# Patient Record
Sex: Male | Born: 1990 | Race: White | Hispanic: No | Marital: Married | Smoking: Never smoker
Health system: Southern US, Community
[De-identification: ages and names within clinical notes are randomized; demographics above are authoritative.]

## PROBLEM LIST (undated history)

## (undated) DIAGNOSIS — A4902 Methicillin resistant Staphylococcus aureus infection, unspecified site: Secondary | ICD-10-CM

## (undated) HISTORY — PX: HERNIA REPAIR: SHX51

---

## 2011-04-16 ENCOUNTER — Emergency Department (HOSPITAL_BASED_OUTPATIENT_CLINIC_OR_DEPARTMENT_OTHER)
Admission: EM | Admit: 2011-04-16 | Discharge: 2011-04-16 | Disposition: A | Discharge status: Home / Self Care | Payer: BC Managed Care – PPO | Attending: Emergency Medicine | Admitting: Emergency Medicine

## 2011-04-16 ENCOUNTER — Encounter: Payer: Self-pay | Admitting: *Deleted

## 2011-04-16 DIAGNOSIS — T1590XA Foreign body on external eye, part unspecified, unspecified eye, initial encounter: Secondary | ICD-10-CM | POA: Insufficient documentation

## 2011-04-16 DIAGNOSIS — IMO0002 Reserved for concepts with insufficient information to code with codable children: Secondary | ICD-10-CM | POA: Insufficient documentation

## 2011-04-16 DIAGNOSIS — Y9289 Other specified places as the place of occurrence of the external cause: Secondary | ICD-10-CM | POA: Insufficient documentation

## 2011-04-16 MED ORDER — TETRACAINE HCL 0.5 % OP SOLN
2.0000 [drp] | Freq: Once | OPHTHALMIC | Status: AC
  Administered 2011-04-16: 2 [drp] via OPHTHALMIC
  Filled 2011-04-16: qty 2

## 2011-04-16 MED ORDER — TOBRAMYCIN 0.3 % OP SOLN: 1.0000 [drp] | OPHTHALMIC | Status: AC

## 2011-04-16 MED ORDER — TOBRAMYCIN 0.3 % OP SOLN: 1.0000 [drp] | OPHTHALMIC | Status: DC

## 2011-04-16 MED ORDER — FLUORESCEIN SODIUM 1 MG OP STRP
1.0000 | ORAL_STRIP | Freq: Once | OPHTHALMIC | Status: AC
  Administered 2011-04-16: 1 via OPHTHALMIC
  Filled 2011-04-16: qty 1

## 2011-04-16 NOTE — ED Notes (Signed)
Pt c/o pain to left eye and redness since last night. 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 FB noted during exam. States he has flushed his eye and tried to "scrape the black thing off", but with no relief.

## 2011-04-16 NOTE — ED Provider Notes (Signed)
History     CSN: [id number] Arrival date & time: 04/16/2011  8:15 PM  Chief Complaint  Patient presents with  . Foreign Body in Eye   HPI Patient is a 20 year old male complaining of foreign body in his left eye. 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 He states he feels like he had something fall into his eye last night. It just began to bother him today. He should states he is able to see a little black spot on the left side of his pupil. Unable to get it out. Describes the pain as sore and aching. Denies change in vision.  History reviewed. No pertinent past medical history.  History reviewed. No pertinent past surgical history.  No family history on file.  History  Substance Use Topics  . Smoking status: Never Smoker   . Smokeless tobacco: Not on file  . Alcohol Use: No      Review of Systems  Eyes: Positive for pain, redness and itching. Negative for photophobia.  Neurological: Negative for dizziness and headaches.  All other systems reviewed and are negative.    Physical Exam  BP 124/57  Pulse 67  Temp(Src) 98.3 F (36.8 C) (Oral)  Resp 18  Ht 6' (1.829 m)  Wt 180 lb (81.647 kg)  BMI 24.41 kg/m2  SpO2 100%  Physical Exam  Constitutional: He is oriented to person, place, and time. He appears well-developed and well-nourished.  HENT:  Head: Normocephalic and atraumatic.  Eyes: EOM and lids are normal. Pupils are equal, round, and reactive to light. Right conjunctiva is injected.  Slit lamp exam:      The right eye shows corneal abrasion and foreign body.       The left eye shows fluorescein uptake.    Neurological: He is alert and oriented to person, place, and time.  Skin: Skin is warm and dry. No rash noted. No erythema. No pallor.  Psychiatric: He has a normal mood and affect. His behavior is normal.    ED Course  FOREIGN BODY REMOVAL Performed by: Thomasene Lot Authorized by: Joya Gaskins    MDM   Visual acuity 20/20 both eyes    Thomasene Lot, PA 04/16/11 2227

## 2011-04-16 NOTE — ED Provider Notes (Signed)
Medical screening examination/treatment/procedure(s) were conducted as a shared visit with non-physician practitioner(s) and myself.  I personally evaluated the patient during the encounter   On my exam, pt has rust ring to left eye.  No FB noted. Visual acuity noted D/w dr Gwen Pounds, start tobramycin and f/u tomorrow for rust ring removal Pt agreeable Globe intact on my exam  Joya Gaskins, MD 04/17/11 0000

## 2011-06-07 ENCOUNTER — Emergency Department (INDEPENDENT_AMBULATORY_CARE_PROVIDER_SITE_OTHER): Payer: BC Managed Care – PPO

## 2011-06-07 ENCOUNTER — Encounter (HOSPITAL_BASED_OUTPATIENT_CLINIC_OR_DEPARTMENT_OTHER): Payer: Self-pay | Admitting: *Deleted

## 2011-06-07 ENCOUNTER — Emergency Department (HOSPITAL_BASED_OUTPATIENT_CLINIC_OR_DEPARTMENT_OTHER)
Admission: EM | Admit: 2011-06-07 | Discharge: 2011-06-07 | Disposition: A | Discharge status: Home / Self Care | Payer: BC Managed Care – PPO | Attending: Emergency Medicine | Admitting: Emergency Medicine

## 2011-06-07 DIAGNOSIS — W268XXA Contact with other sharp object(s), not elsewhere classified, initial encounter: Secondary | ICD-10-CM | POA: Insufficient documentation

## 2011-06-07 DIAGNOSIS — R609 Edema, unspecified: Secondary | ICD-10-CM

## 2011-06-07 DIAGNOSIS — L02519 Cutaneous abscess of unspecified hand: Secondary | ICD-10-CM | POA: Insufficient documentation

## 2011-06-07 DIAGNOSIS — Y9289 Other specified places as the place of occurrence of the external cause: Secondary | ICD-10-CM | POA: Insufficient documentation

## 2011-06-07 DIAGNOSIS — L03114 Cellulitis of left upper limb: Secondary | ICD-10-CM

## 2011-06-07 HISTORY — DX: Methicillin resistant Staphylococcus aureus infection, unspecified site: A49.02

## 2011-06-07 MED ORDER — HYDROCODONE-ACETAMINOPHEN 5-325 MG PO TABS: 2.0000 | ORAL_TABLET | ORAL | Status: AC | PRN

## 2011-06-07 MED ORDER — OXYCODONE-ACETAMINOPHEN 5-325 MG PO TABS
2.0000 | ORAL_TABLET | Freq: Once | ORAL | Status: AC
  Administered 2011-06-07: 2 via ORAL
  Filled 2011-06-07: qty 2

## 2011-06-07 MED ORDER — VANCOMYCIN HCL IN DEXTROSE 1-5 GM/200ML-% IV SOLN
1000.0000 mg | Freq: Once | INTRAVENOUS | Status: AC
  Administered 2011-06-07: 1000 mg via INTRAVENOUS
  Filled 2011-06-07: qty 200

## 2011-06-07 MED ORDER — CEPHALEXIN 500 MG PO CAPS: 500.0000 mg | ORAL_CAPSULE | Freq: Four times a day (QID) | ORAL | Status: AC

## 2011-06-07 MED ORDER — DEXTROSE 5 % IV SOLN
1.0000 g | INTRAVENOUS | Status: DC
  Administered 2011-06-07: 1 g via INTRAVENOUS
  Filled 2011-06-07 (×2): qty 10

## 2011-06-07 NOTE — ED Provider Notes (Signed)
History     CSN: [id number] Arrival date & time: 06/07/2011  6:53 PM   First MD Initiated Contact with Patient 06/07/11 1913      Chief Complaint  Patient presents with  . Hand Problem    (Consider location/radiation/quality/duration/timing/severity/associated sxs/prior treatment) Patient is a 20 y.o. male presenting with hand injury. The history is provided by the patient. No language interpreter was used.  Hand Injury  The incident occurred more than 2 days ago. The incident occurred at work. The injury mechanism was an incision. The pain is present in the left hand. The quality of the pain is described as aching and throbbing. The pain is at a severity of 8/10. The pain is moderate. He reports no foreign bodies present. The treatment provided moderate relief.  Pt has been on antibiotics since Thursday.  Pt reports he was cut with a piece of metal at work and hand is swelling.  Past Medical History  Diagnosis Date  . MRSA (methicillin resistant Staphylococcus aureus)     Past Surgical History  Procedure Date  . Hernia repair     History reviewed. No pertinent family history.  History  Substance Use Topics  . Smoking status: Never Smoker   . Smokeless tobacco: Not on file  . Alcohol Use: No      Review of Systems  Musculoskeletal: Positive for myalgias.  Skin: Positive for wound.  All other systems reviewed and are negative.    Allergies  Review of patient's allergies indicates no known allergies.  Home Medications   Current Outpatient Rx  Name Route Sig Dispense Refill  . DOXYCYCLINE HYCLATE 100 MG PO CPEP Oral Take 100 mg by mouth 2 (two) times daily.      . NEOMY-BACIT-POLYMYX-PRAMOXINE 1 % EX OINT Topical Apply 1 application topically 3 (three) times daily.        BP 121/51  Pulse 78  Temp(Src) 98.2 F (36.8 C) (Oral)  Resp 18  Ht 6' (1.829 m)  Wt 182 lb (82.555 kg)  BMI 24.68 kg/m2  SpO2 99%  Physical Exam  Nursing note and vitals  reviewed. Constitutional: He appears well-developed and well-nourished.  HENT:  Head: Normocephalic.  Eyes: Pupils are equal, round, and reactive to light.  Cardiovascular: Normal rate.   Pulmonary/Chest: Effort normal.  Musculoskeletal: He exhibits edema and tenderness.  Neurological: He is alert.  Skin: Skin is warm. There is erythema.  Psychiatric: He has a normal mood and affect.    ED Course  Procedures (including critical care time)  Labs Reviewed - No data to display Dg Hand Complete Left  06/07/2011  *RADIOLOGY REPORT*  Clinical Data: Laceration 1 week ago.  Swelling.  LEFT HAND - COMPLETE 3+ VIEW  Comparison: None  Findings: There is soft tissue swelling in the hypothenar eminence region.  No evidence of radiopaque foreign object or fracture.  IMPRESSION: Soft tissue swelling in the hypothenar eminence region.  Original Report Authenticated By: Thomasenia Sales, M.D.     No diagnosis found.    MDM  I spoke with Dr. Amanda Pea.  Pt given IV antibiotics.  Pt advised to return here tomorrow for rechec with me and 2nd antibiotic dosage.        Langston Masker, [place] 06/07/11 2236  Langston Masker, [place] 06/07/11 2237

## 2011-06-07 NOTE — ED Provider Notes (Signed)
Medical screening examination/treatment/procedure(s) were performed by non-physician practitioner and as supervising physician I was immediately available for consultation/collaboration.   Glynn Octave, MD 06/07/11 [phone]

## 2011-06-07 NOTE — ED Notes (Signed)
Pt reports he cut his hand on metal at work 1 week ago- went to doctor on Thursday and was given doxycycline- woke up Friday am and hand was red and swollen- has hx of MRSA

## 2011-06-08 ENCOUNTER — Emergency Department (HOSPITAL_BASED_OUTPATIENT_CLINIC_OR_DEPARTMENT_OTHER)
Admission: EM | Admit: 2011-06-08 | Discharge: 2011-06-08 | Disposition: A | Discharge status: Home / Self Care | Payer: BC Managed Care – PPO | Attending: Emergency Medicine | Admitting: Emergency Medicine

## 2011-06-08 ENCOUNTER — Encounter (HOSPITAL_BASED_OUTPATIENT_CLINIC_OR_DEPARTMENT_OTHER): Payer: Self-pay | Admitting: Emergency Medicine

## 2011-06-08 DIAGNOSIS — L02519 Cutaneous abscess of unspecified hand: Secondary | ICD-10-CM | POA: Insufficient documentation

## 2011-06-08 DIAGNOSIS — L03119 Cellulitis of unspecified part of limb: Secondary | ICD-10-CM | POA: Insufficient documentation

## 2011-06-08 DIAGNOSIS — L03114 Cellulitis of left upper limb: Secondary | ICD-10-CM

## 2011-06-08 MED ORDER — VANCOMYCIN HCL IN DEXTROSE 1-5 GM/200ML-% IV SOLN
1000.0000 mg | Freq: Once | INTRAVENOUS | Status: AC
  Administered 2011-06-08: 1000 mg via INTRAVENOUS
  Filled 2011-06-08: qty 200

## 2011-06-08 MED ORDER — DEXTROSE 5 % IV SOLN
1.0000 g | INTRAVENOUS | Status: DC
  Administered 2011-06-08: 1 g via INTRAVENOUS
  Filled 2011-06-08: qty 10

## 2011-06-08 NOTE — ED Provider Notes (Signed)
History     CSN: [id number] Arrival date & time: 06/08/2011 12:09 PM   First MD Initiated Contact with Patient 06/08/11 1216      Chief Complaint  Patient presents with  . Wound Check    (Consider location/radiation/quality/duration/timing/severity/associated sxs/prior treatment) Patient is a 20 y.o. male presenting with hand pain. The history is provided by the patient. No language interpreter was used.  Hand Pain This is a new problem. The current episode started in the past 7 days. The problem occurs constantly. The problem has been gradually improving. The symptoms are aggravated by nothing. He has tried nothing for the symptoms.  Hand Pain This is a new problem. The current episode started in the past 7 days. The problem occurs constantly. The problem has been gradually improving. The symptoms are aggravated by nothing. He has tried nothing for the symptoms.  Pt seen here by me yesterday and given Iv antibiotics,  I spoke to Dr. Amanda Pea for advise.  Pt here for 2nd dosage of antibiotics  Past Medical History  Diagnosis Date  . MRSA (methicillin resistant Staphylococcus aureus)     Past Surgical History  Procedure Date  . Hernia repair     History reviewed. No pertinent family history.  History  Substance Use Topics  . Smoking status: Never Smoker   . Smokeless tobacco: Not on file  . Alcohol Use: No      Review of Systems  Skin: Positive for wound.  All other systems reviewed and are negative.    Allergies  Review of patient's allergies indicates no known allergies.  Home Medications   Current Outpatient Rx  Name Route Sig Dispense Refill  . CEPHALEXIN 500 MG PO CAPS Oral Take 1 capsule (500 mg total) by mouth 4 (four) times daily. 40 capsule 0  . DOXYCYCLINE HYCLATE 100 MG PO CPEP Oral Take 100 mg by mouth 2 (two) times daily.      Marland Kitchen HYDROCODONE-ACETAMINOPHEN 5-325 MG PO TABS Oral Take 2 tablets by mouth every 4 (four) hours as needed for pain. 16 tablet  0  . NEOMY-BACIT-POLYMYX-PRAMOXINE 1 % EX OINT Topical Apply 1 application topically 3 (three) times daily.        BP 116/51  Pulse 62  Temp(Src) 97.8 F (36.6 C) (Oral)  Resp 18  Ht 6' (1.829 m)  Wt 183 lb 1.6 oz (83.054 kg)  BMI 24.83 kg/m2  SpO2 100%  Physical Exam  Nursing note and vitals reviewed. Constitutional: He is oriented to person, place, and time. He appears well-developed and well-nourished.  Musculoskeletal: He exhibits edema and tenderness.       Swollen left hand,  Decreased redness,  Decreased erythema,  Looks improved from yesterday  Neurological: He is alert and oriented to person, place, and time.  Skin: Skin is warm. There is erythema.  Psychiatric: He has a normal mood and affect.    ED Course  Procedures (including critical care time)  Labs Reviewed - No data to display Dg Hand Complete Left  06/07/2011  *RADIOLOGY REPORT*  Clinical Data: Laceration 1 week ago.  Swelling.  LEFT HAND - COMPLETE 3+ VIEW  Comparison: None  Findings: There is soft tissue swelling in the hypothenar eminence region.  No evidence of radiopaque foreign object or fracture.  IMPRESSION: Soft tissue swelling in the hypothenar eminence region.  Original Report Authenticated By: Thomasenia Sales, M.D.     1. Cellulitis of hand, left       MDM  Pt given IV  antibiotics x 2,  Pt advised to continue po medications.  Pt to call Dr. Amanda Pea in am to be seen for evaluation.    Medical screening examination/treatment/procedure(s) were performed by non-physician practitioner and as supervising physician I was immediately available for consultation/collaboration. Osvaldo Human, M.D.    [place], [place] 06/08/11 1441  Carleene Cooper III, MD 06/09/11 [phone]

## 2011-06-08 NOTE — ED Notes (Signed)
Wound recheck.  No problems with hand.  No red streaks.  No known fever.

## 2011-06-08 NOTE — ED Notes (Signed)
Pt d/c home with instructions to f/u tomorrow with Dr Amanda Pea. Family present to drive. No new rx given. Pt v/o understanding to continue antibiotics

## 2011-06-10 LAB — WOUND CULTURE: Gram Stain: NONE SEEN

## 2011-06-10 NOTE — ED Notes (Signed)
+   MRSA Patient treated with Cephalexin-chart sent to EDP office for review.

## 2011-06-17 NOTE — ED Notes (Signed)
Start Bactrim DS 160/800 1 po BID x 10 days  Per Cherrie Distance.

## 2011-06-18 ENCOUNTER — Telehealth (HOSPITAL_COMMUNITY): Payer: Self-pay | Admitting: Emergency Medicine

## 2016-09-05 ENCOUNTER — Ambulatory Visit
Admission: EM | Admit: 2016-09-05 | Discharge: 2016-09-05 | Disposition: A | Discharge status: Home / Self Care | Payer: BLUE CROSS/BLUE SHIELD | Attending: Internal Medicine | Admitting: Internal Medicine

## 2016-09-05 DIAGNOSIS — M25511 Pain in right shoulder: Secondary | ICD-10-CM

## 2016-09-05 DIAGNOSIS — G8911 Acute pain due to trauma: Secondary | ICD-10-CM

## 2016-09-05 MED ORDER — NAPROXEN 500 MG PO TABS: 500.0000 mg | ORAL_TABLET | Freq: Two times a day (BID) | ORAL | 0 refills | Status: DC

## 2016-09-05 NOTE — ED Triage Notes (Signed)
Pt reports on Monday or Tuesday he fell forward and injured his right shoulder, but unsure how. Can lift his arm but not without pain. 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 Pain 2/10 at rest, but increases to 9/10 with movement.

## 2016-09-05 NOTE — Discharge Instructions (Addendum)
Suspect rotator cuff injury based on type of pain and what movements hurt the most.  Anticipate good recovery, but may not be completely pain free in a month.  Ice 5-10 minutes 2-4 times daily; prescription for naproxen (anti inflammatory, pain relief) sent to pharmacy.  Limit painful activities. Followup with orthopedist.  Note for work x 3d.

## 2016-09-05 NOTE — ED Provider Notes (Signed)
[facility]    CSN: [id number] Arrival date & time: 09/05/16  0849     History   Chief Complaint Chief Complaint  Patient presents with  . Shoulder Injury    HPI Marvin Dominguez is a 26 y.o. male. 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 He was holding a heavy box overhead 3 days ago, and lost his balance and fell, presumably hyperextending the shoulder. He has had significant pain in the right shoulder since. No neck or back pain, no other injuries reported. Pain is 2 out of 10 at rest, but with movement 9 out of 10. He is able to extend the arm fully overhead. No elbow symptoms, no hand or wrist symptoms.   HPI  Past Medical History:  Diagnosis Date  . MRSA (methicillin resistant Staphylococcus aureus)     Past Surgical History:  Procedure Laterality Date  . HERNIA REPAIR         Home Medications    Prior to Admission medications   Medication Sig Start Date End Date Taking? Authorizing Provider  naproxen (NAPROSYN) 500 MG tablet Take 1 tablet (500 mg total) by mouth 2 (two) times daily. 09/05/16   Eustace Moore, MD    Family History History reviewed. No pertinent family history.  Social History Social History  Substance Use Topics  . Smoking status: Never Smoker  . Smokeless tobacco: Never Used  . Alcohol use Yes     Comment: social     Allergies   Patient has no known allergies.   Review of Systems Review of Systems  All other systems reviewed and are negative.    Physical Exam Triage Vital Signs ED Triage Vitals  Enc Vitals Group     BP 09/05/16 0910 115/62     Pulse Rate 09/05/16 0910 60     Resp 09/05/16 0910 18     Temp 09/05/16 0910 97.9 F (36.6 C)     Temp Source 09/05/16 0910 Oral     SpO2 09/05/16 0910 100 %     Weight 09/05/16 0911 180 lb (81.6 kg)     Height 09/05/16 0911 5\' 11"  (1.803 m)     Pain Score 09/05/16 0913 2     Pain Loc --    Updated Vital Signs BP 115/62  (BP Location: Left Arm)   Pulse 60   Temp 97.9 F (36.6 C) (Oral)   Resp 18   Ht 5\' 11"  (1.803 m)   Wt 180 lb (81.6 kg)   SpO2 100%   BMI 25.10 kg/m   Physical Exam  Constitutional: He is oriented to person, place, and time. No distress.  Alert, nicely groomed  HENT:  Head: Atraumatic.  Eyes:  Conjugate gaze, no eye redness/drainage  Neck: Neck supple.  Shoulder pain does not change with neck range of motion.  Cardiovascular: Normal rate.   Pulmonary/Chest: No respiratory distress.  Abdominal: Soft. He exhibits no distension.  Musculoskeletal: Normal range of motion.  Able to fully extend the right arm overhead unassisted, but painful. Also painful to externally rotate. Less painful to internally rotate. Hand is warm with good wrist pulses. Shoulder is tender to palpation anteriorly.  Neurological: He is alert and oriented to person, place, and time.  Skin: Skin is warm and dry.  No cyanosis  Nursing note and vitals reviewed.    UC Treatments / Results   Procedures Procedures (including critical care time) None today  Final  Clinical Impressions(s) / UC Diagnoses   Final diagnoses:  Acute shoulder pain due to trauma, right  probably acute rotator cuff injury  Suspect rotator cuff injury based on type of pain and what movements hurt the most.  Anticipate good recovery, but may not be completely pain free in a month.  Ice 5-10 minutes 2-4 times daily; prescription for naproxen (anti inflammatory, pain relief) sent to pharmacy.  Limit painful activities. Followup with orthopedist.  Note for work x 3d; RTW 09/09/16; further work note/duty restrictions per orthopedics/sports med.  New Prescriptions New Prescriptions   NAPROXEN (NAPROSYN) 500 MG TABLET    Take 1 tablet (500 mg total) by mouth 2 (two) times daily.     Eustace MooreLaura W Jonise Weightman, MD 09/06/16 2151

## 2017-08-28 ENCOUNTER — Other Ambulatory Visit: Payer: Self-pay | Admitting: Physician Assistant

## 2017-08-28 DIAGNOSIS — S46011A Strain of muscle(s) and tendon(s) of the rotator cuff of right shoulder, initial encounter: Secondary | ICD-10-CM

## 2017-09-04 ENCOUNTER — Ambulatory Visit: Payer: BLUE CROSS/BLUE SHIELD

## 2017-09-11 ENCOUNTER — Ambulatory Visit: Payer: BLUE CROSS/BLUE SHIELD

## 2017-10-02 ENCOUNTER — Ambulatory Visit: Payer: BLUE CROSS/BLUE SHIELD

## 2017-10-19 ENCOUNTER — Ambulatory Visit
Admission: RE | Admit: 2017-10-19 | Discharge: 2017-10-19 | Disposition: A | Discharge status: Home / Self Care | Payer: BC Managed Care – PPO | Source: Ambulatory Visit | Attending: Physician Assistant | Admitting: Physician Assistant

## 2017-10-19 DIAGNOSIS — S46011A Strain of muscle(s) and tendon(s) of the rotator cuff of right shoulder, initial encounter: Secondary | ICD-10-CM | POA: Insufficient documentation

## 2017-10-19 DIAGNOSIS — W19XXXA Unspecified fall, initial encounter: Secondary | ICD-10-CM | POA: Diagnosis not present

## 2017-10-19 MED ORDER — LIDOCAINE HCL (PF) 1 % IJ SOLN
10.0000 mL | Freq: Once | INTRAMUSCULAR | Status: AC
  Administered 2017-10-19: 10 mL
  Filled 2017-10-19: qty 10

## 2017-10-19 MED ORDER — IOPAMIDOL (ISOVUE-200) INJECTION 41%
15.0000 mL | Freq: Once | INTRAVENOUS | Status: AC | PRN
  Administered 2017-10-19: 10 mL
  Filled 2017-10-19: qty 50

## 2017-10-19 MED ORDER — GADOBENATE DIMEGLUMINE 529 MG/ML IV SOLN
0.1000 mL | Freq: Once | INTRAVENOUS | Status: AC | PRN
  Administered 2017-10-19: 0.1 mL via INTRA_ARTICULAR

## 2018-07-16 ENCOUNTER — Other Ambulatory Visit: Payer: Self-pay | Admitting: Orthopedic Surgery

## 2018-07-19 ENCOUNTER — Other Ambulatory Visit: Payer: Self-pay

## 2018-07-19 ENCOUNTER — Encounter
Admission: RE | Admit: 2018-07-19 | Discharge: 2018-07-19 | Disposition: A | Discharge status: Home / Self Care | Payer: BC Managed Care – PPO | Source: Ambulatory Visit | Attending: Orthopedic Surgery | Admitting: Orthopedic Surgery

## 2018-07-19 MED ORDER — CEFAZOLIN SODIUM-DEXTROSE 2-4 GM/100ML-% IV SOLN
2.0000 g | INTRAVENOUS | Status: AC
  Administered 2018-07-20: 2 g via INTRAVENOUS

## 2018-07-19 NOTE — Patient Instructions (Addendum)
  Your procedure is scheduled on: Tuesday July 20, 2018 @ 8:00am Report to Same Day Surgery 2nd floor [facility](Medical Mall Entrance-take elevator on left to 2nd floor.  Check in with surgery information desk.)  Remember: Instructions that are not followed completely may result in serious medical risk, up to and including death, or upon the discretion of your surgeon and anesthesiologist your surgery may need to be rescheduled.    __x__ 1. Do not eat food (including mints, candies, chewing gum) after midnight the night before your procedure. You may drink clear liquids up to 2 hours before you are scheduled to arrive at the hospital for your procedure.  Do not drink anything within 2 hours of your scheduled arrival to the hospital.  Approved clear liquids:  --Water or Apple juice without pulp  --Clear carbohydrate beverage such as Gatorade or Powerade  --Black Coffee or Clear Tea (No milk, no creamers, do not add anything to the coffee or tea)    __x__ 2. No Alcohol for 24 hours before or after surgery.   __x__ 3. No Smoking or e-cigarettes for 24 hours before surgery.  Do not use any chewable tobacco products for at least 6 hours before surgery.   __x__ 4. Notify your doctor if there is any change in your medical condition (cold, fever, infections).   __x__ 5. On the morning of surgery brush your teeth with toothpaste and water.  You may rinse your mouth with mouthwash if you wish.  Do not swallow any toothpaste or mouthwash.   Do not wear jewelry on the day of surgery.  Do not wear lotions, powders, deodorant, or perfumes.   Do not bring valuables to the hospital.    [facility] is not responsible for any belongings or valuables.               Contacts, dentures or bridgework may not be worn into surgery.  For patients discharged on the day of surgery, you will NOT be permitted to drive yourself home.  You must have a responsible adult with you for 24 hours after  surgery.  __x__ Take anti-hypertensive listed below, cardiac, seizure, asthma, anti-reflux and psychiatric medicines. These include:  1. None on the morning of surgery  RN Reviewed on phone w/ patient on 07/19/2018

## 2018-07-20 ENCOUNTER — Encounter
Admission: RE | Disposition: A | Discharge status: Home / Self Care | Payer: Self-pay | Source: Home / Self Care | Attending: Orthopedic Surgery

## 2018-07-20 ENCOUNTER — Ambulatory Visit
Admission: RE | Admit: 2018-07-20 | Discharge: 2018-07-20 | Disposition: A | Discharge status: Home / Self Care | Payer: BC Managed Care – PPO | Attending: Orthopedic Surgery | Admitting: Orthopedic Surgery

## 2018-07-20 ENCOUNTER — Ambulatory Visit: Payer: BC Managed Care – PPO | Admitting: Certified Registered"

## 2018-07-20 DIAGNOSIS — S43431A Superior glenoid labrum lesion of right shoulder, initial encounter: Secondary | ICD-10-CM | POA: Diagnosis present

## 2018-07-20 DIAGNOSIS — X58XXXA Exposure to other specified factors, initial encounter: Secondary | ICD-10-CM | POA: Insufficient documentation

## 2018-07-20 HISTORY — PX: SHOULDER ARTHROSCOPY WITH SUBACROMIAL DECOMPRESSION AND BICEP TENDON REPAIR: SHX5689

## 2018-07-20 LAB — PROTIME-INR
INR: 1.1
Prothrombin Time: 14.1 seconds (ref 11.4–15.2)

## 2018-07-20 LAB — CBC WITH DIFFERENTIAL/PLATELET
ABS IMMATURE GRANULOCYTES: 0.02 10*3/uL (ref 0.00–0.07)
Basophils Absolute: 0 10*3/uL (ref 0.0–0.1)
Basophils Relative: 1 %
Eosinophils Absolute: 0.1 10*3/uL (ref 0.0–0.5)
Eosinophils Relative: 2 %
HCT: 45.4 % (ref 39.0–52.0)
Hemoglobin: 15.8 g/dL (ref 13.0–17.0)
IMMATURE GRANULOCYTES: 0 %
Lymphocytes Relative: 32 %
Lymphs Abs: 2 10*3/uL (ref 0.7–4.0)
MCH: 29.9 pg (ref 26.0–34.0)
MCHC: 34.8 g/dL (ref 30.0–36.0)
MCV: 86 fL (ref 80.0–100.0)
MONO ABS: 0.6 10*3/uL (ref 0.1–1.0)
MONOS PCT: 10 %
NEUTROS PCT: 55 %
Neutro Abs: 3.5 10*3/uL (ref 1.7–7.7)
PLATELETS: 195 10*3/uL (ref 150–400)
RBC: 5.28 MIL/uL (ref 4.22–5.81)
RDW: 12.2 % (ref 11.5–15.5)
WBC: 6.3 10*3/uL (ref 4.0–10.5)
nRBC: 0 % (ref 0.0–0.2)

## 2018-07-20 LAB — BASIC METABOLIC PANEL
Anion gap: 7 (ref 5–15)
BUN: 17 mg/dL (ref 6–20)
CALCIUM: 8.8 mg/dL — AB (ref 8.9–10.3)
CO2: 25 mmol/L (ref 22–32)
Chloride: 106 mmol/L (ref 98–111)
Creatinine, Ser: 0.93 mg/dL (ref 0.61–1.24)
Glucose, Bld: 98 mg/dL (ref 70–99)
Potassium: 3.8 mmol/L (ref 3.5–5.1)
Sodium: 138 mmol/L (ref 135–145)

## 2018-07-20 LAB — APTT: aPTT: 35 seconds (ref 24–36)

## 2018-07-20 SURGERY — SHOULDER ARTHROSCOPY WITH SUBACROMIAL DECOMPRESSION AND BICEP TENDON REPAIR
Anesthesia: General | Site: Shoulder | Laterality: Right

## 2018-07-20 MED ORDER — FENTANYL CITRATE (PF) 100 MCG/2ML IJ SOLN
INTRAMUSCULAR | Status: AC
  Administered 2018-07-20: 50 ug
  Filled 2018-07-20: qty 2

## 2018-07-20 MED ORDER — ROCURONIUM BROMIDE 100 MG/10ML IV SOLN
INTRAVENOUS | Status: DC | PRN
  Administered 2018-07-20: 50 mg via INTRAVENOUS

## 2018-07-20 MED ORDER — OXYCODONE HCL 5 MG PO TABS: 5.0000 mg | ORAL_TABLET | Freq: Once | ORAL | Status: DC | PRN

## 2018-07-20 MED ORDER — PROMETHAZINE HCL 25 MG/ML IJ SOLN: 6.2500 mg | INTRAMUSCULAR | Status: DC | PRN

## 2018-07-20 MED ORDER — PHENYLEPHRINE HCL 10 MG/ML IJ SOLN
INTRAMUSCULAR | Status: DC | PRN
  Administered 2018-07-20: 100 ug via INTRAVENOUS

## 2018-07-20 MED ORDER — FAMOTIDINE 20 MG PO TABS
ORAL_TABLET | ORAL | Status: AC
  Administered 2018-07-20: 20 mg via ORAL
  Filled 2018-07-20: qty 1

## 2018-07-20 MED ORDER — MIDAZOLAM HCL 2 MG/2ML IJ SOLN
1.0000 mg | Freq: Once | INTRAMUSCULAR | Status: AC
  Administered 2018-07-20: 1 mg via INTRAVENOUS

## 2018-07-20 MED ORDER — LACTATED RINGERS IV SOLN
INTRAVENOUS | Status: DC | PRN
  Administered 2018-07-20: 13 mL

## 2018-07-20 MED ORDER — CHLORHEXIDINE GLUCONATE CLOTH 2 % EX PADS: 6.0000 | MEDICATED_PAD | Freq: Once | CUTANEOUS | Status: DC

## 2018-07-20 MED ORDER — FENTANYL CITRATE (PF) 100 MCG/2ML IJ SOLN: 25.0000 ug | INTRAMUSCULAR | Status: DC | PRN

## 2018-07-20 MED ORDER — BUPIVACAINE HCL (PF) 0.25 % IJ SOLN
INTRAMUSCULAR | Status: AC
  Filled 2018-07-20: qty 30

## 2018-07-20 MED ORDER — PROPOFOL 10 MG/ML IV BOLUS
INTRAVENOUS | Status: AC
  Filled 2018-07-20: qty 20

## 2018-07-20 MED ORDER — BUPIVACAINE LIPOSOME 1.3 % IJ SUSP
INTRAMUSCULAR | Status: AC
  Filled 2018-07-20: qty 20

## 2018-07-20 MED ORDER — MIDAZOLAM HCL 2 MG/2ML IJ SOLN
INTRAMUSCULAR | Status: AC
  Filled 2018-07-20: qty 2

## 2018-07-20 MED ORDER — CEFAZOLIN SODIUM-DEXTROSE 2-4 GM/100ML-% IV SOLN
INTRAVENOUS | Status: AC
  Filled 2018-07-20: qty 100

## 2018-07-20 MED ORDER — ONDANSETRON HCL 4 MG PO TABS: 4.0000 mg | ORAL_TABLET | Freq: Three times a day (TID) | ORAL | 0 refills | Status: AC | PRN

## 2018-07-20 MED ORDER — LACTATED RINGERS IV SOLN
INTRAVENOUS | Status: DC
  Administered 2018-07-20: 09:00:00 via INTRAVENOUS

## 2018-07-20 MED ORDER — DEXAMETHASONE SODIUM PHOSPHATE 10 MG/ML IJ SOLN
INTRAMUSCULAR | Status: DC | PRN
  Administered 2018-07-20: 5 mg via INTRAVENOUS

## 2018-07-20 MED ORDER — FAMOTIDINE 20 MG PO TABS
20.0000 mg | ORAL_TABLET | Freq: Once | ORAL | Status: AC
  Administered 2018-07-20: 20 mg via ORAL

## 2018-07-20 MED ORDER — LIDOCAINE HCL (CARDIAC) PF 100 MG/5ML IV SOSY
PREFILLED_SYRINGE | INTRAVENOUS | Status: DC | PRN
  Administered 2018-07-20: 100 mg via INTRAVENOUS

## 2018-07-20 MED ORDER — SUGAMMADEX SODIUM 200 MG/2ML IV SOLN
INTRAVENOUS | Status: DC | PRN
  Administered 2018-07-20: 200 mg via INTRAVENOUS

## 2018-07-20 MED ORDER — BUPIVACAINE LIPOSOME 1.3 % IJ SUSP
INTRAMUSCULAR | Status: DC | PRN
  Administered 2018-07-20: 20 mL via PERINEURAL

## 2018-07-20 MED ORDER — LIDOCAINE HCL (PF) 1 % IJ SOLN
INTRAMUSCULAR | Status: DC | PRN
  Administered 2018-07-20: 3 mL

## 2018-07-20 MED ORDER — MEPERIDINE HCL 50 MG/ML IJ SOLN: 6.2500 mg | INTRAMUSCULAR | Status: DC | PRN

## 2018-07-20 MED ORDER — EPHEDRINE SULFATE 50 MG/ML IJ SOLN
INTRAMUSCULAR | Status: DC | PRN
  Administered 2018-07-20: 5 mg via INTRAVENOUS

## 2018-07-20 MED ORDER — BUPIVACAINE HCL (PF) 0.5 % IJ SOLN
INTRAMUSCULAR | Status: AC
  Filled 2018-07-20: qty 10

## 2018-07-20 MED ORDER — OXYCODONE HCL 5 MG PO TABS: 5.0000 mg | ORAL_TABLET | ORAL | 0 refills | Status: AC | PRN

## 2018-07-20 MED ORDER — OXYCODONE HCL 5 MG/5ML PO SOLN: 5.0000 mg | Freq: Once | ORAL | Status: DC | PRN

## 2018-07-20 MED ORDER — FENTANYL CITRATE (PF) 100 MCG/2ML IJ SOLN
INTRAMUSCULAR | Status: AC
  Filled 2018-07-20: qty 2

## 2018-07-20 MED ORDER — PROPOFOL 10 MG/ML IV BOLUS
INTRAVENOUS | Status: DC | PRN
  Administered 2018-07-20: 200 mg via INTRAVENOUS

## 2018-07-20 MED ORDER — BUPIVACAINE HCL (PF) 0.5 % IJ SOLN
INTRAMUSCULAR | Status: DC | PRN
  Administered 2018-07-20: 10 mL via PERINEURAL

## 2018-07-20 MED ORDER — BUPIVACAINE HCL (PF) 0.25 % IJ SOLN
INTRAMUSCULAR | Status: DC | PRN
  Administered 2018-07-20: 20 mL

## 2018-07-20 MED ORDER — LIDOCAINE HCL (PF) 1 % IJ SOLN
INTRAMUSCULAR | Status: AC
  Filled 2018-07-20: qty 5

## 2018-07-20 MED ORDER — FENTANYL CITRATE (PF) 100 MCG/2ML IJ SOLN
50.0000 ug | Freq: Once | INTRAMUSCULAR | Status: AC
  Administered 2018-07-20: 100 ug via INTRAVENOUS

## 2018-07-20 MED ORDER — ONDANSETRON HCL 4 MG/2ML IJ SOLN
INTRAMUSCULAR | Status: DC | PRN
  Administered 2018-07-20: 4 mg via INTRAVENOUS

## 2018-07-20 MED ORDER — EPINEPHRINE 30 MG/30ML IJ SOLN
INTRAMUSCULAR | Status: AC
  Filled 2018-07-20: qty 1

## 2018-07-20 SURGICAL SUPPLY — 61 items
ADAPTER IRRIG TUBE 2 SPIKE SOL (ADAPTER) ×4 IMPLANT
ANCHOR SUT BIOC ST 3X145 (Anchor) ×4 IMPLANT
BUR RADIUS 4.0X18.5 (BURR) ×2 IMPLANT
BUR RADIUS 5.5 (BURR) ×2 IMPLANT
CANISTER SUCT LVC 12 LTR MEDI- (MISCELLANEOUS) IMPLANT
CANNULA 5.75X7 CRYSTAL CLEAR (CANNULA) ×4 IMPLANT
CANNULA PARTIAL THREAD 2X7 (CANNULA) ×2 IMPLANT
CANNULA TWIST IN 8.25X9CM (CANNULA) IMPLANT
COOLER POLAR GLACIER W/PUMP (MISCELLANEOUS) ×2 IMPLANT
COVER WAND RF STERILE (DRAPES) IMPLANT
CRADLE LAMINECT ARM (MISCELLANEOUS) ×2 IMPLANT
DEVICE SUCT BLK HOLE OR FLOOR (MISCELLANEOUS) ×2 IMPLANT
DRAPE IMP U-DRAPE 54X76 (DRAPES) ×4 IMPLANT
DRAPE INCISE IOBAN 66X45 STRL (DRAPES) ×2 IMPLANT
DRAPE SHEET LG 3/4 BI-LAMINATE (DRAPES) ×2 IMPLANT
DRAPE U-SHAPE 47X51 STRL (DRAPES) IMPLANT
DURAPREP 26ML APPLICATOR (WOUND CARE) ×8 IMPLANT
ELECT REM PT RETURN 9FT ADLT (ELECTROSURGICAL)
ELECTRODE REM PT RTRN 9FT ADLT (ELECTROSURGICAL) IMPLANT
GAUZE PETRO XEROFOAM 1X8 (MISCELLANEOUS) ×2 IMPLANT
GAUZE SPONGE 4X4 12PLY STRL (GAUZE/BANDAGES/DRESSINGS) ×2 IMPLANT
GLOVE BIOGEL PI IND STRL 9 (GLOVE) ×1 IMPLANT
GLOVE BIOGEL PI INDICATOR 9 (GLOVE) ×1
GLOVE SURG 9.0 ORTHO LTXF (GLOVE) ×4 IMPLANT
GOWN STRL REUS TWL 2XL XL LVL4 (GOWN DISPOSABLE) ×2 IMPLANT
GOWN STRL REUS W/ TWL LRG LVL3 (GOWN DISPOSABLE) ×2 IMPLANT
GOWN STRL REUS W/ TWL LRG LVL4 (GOWN DISPOSABLE) ×1 IMPLANT
GOWN STRL REUS W/TWL LRG LVL3 (GOWN DISPOSABLE) ×2
GOWN STRL REUS W/TWL LRG LVL4 (GOWN DISPOSABLE) ×1
IV LACTATED RINGER IRRG 3000ML (IV SOLUTION) ×13
IV LR IRRIG 3000ML ARTHROMATIC (IV SOLUTION) ×13 IMPLANT
KIT STABILIZATION SHOULDER (MISCELLANEOUS) ×2 IMPLANT
KIT SUTURETAK 3.0 INSERT PERC (KITS) ×2 IMPLANT
KIT TURNOVER KIT A (KITS) ×2 IMPLANT
MANIFOLD NEPTUNE II (INSTRUMENTS) ×2 IMPLANT
MASK FACE SPIDER DISP (MASK) ×2 IMPLANT
MAT ABSORB  FLUID 56X50 GRAY (MISCELLANEOUS) ×2
MAT ABSORB FLUID 56X50 GRAY (MISCELLANEOUS) ×2 IMPLANT
NEEDLE HYPO 22GX1.5 SAFETY (NEEDLE) ×2 IMPLANT
PACK ARTHROSCOPY SHOULDER (MISCELLANEOUS) ×2 IMPLANT
PAD WRAPON POLAR SHDR XLG (MISCELLANEOUS) ×1 IMPLANT
SET TUBE SUCT SHAVER OUTFL 24K (TUBING) ×2 IMPLANT
SET TUBE TIP INTRA-ARTICULAR (MISCELLANEOUS) ×2 IMPLANT
STRAP SAFETY 5IN WIDE (MISCELLANEOUS) ×2 IMPLANT
STRIP CLOSURE SKIN 1/2X4 (GAUZE/BANDAGES/DRESSINGS) ×2 IMPLANT
SUT ETHILON 4-0 (SUTURE) ×2
SUT ETHILON 4-0 FS2 18XMFL BLK (SUTURE) ×2
SUT LASSO 90 DEG SD STR (SUTURE) ×2 IMPLANT
SUT MNCRL 4-0 (SUTURE)
SUT MNCRL 4-0 27XMFL (SUTURE)
SUT PDS AB 0 CT1 27 (SUTURE) IMPLANT
SUT VIC AB 0 CT1 36 (SUTURE) IMPLANT
SUT VIC AB 2-0 CT2 27 (SUTURE) IMPLANT
SUTURE ETHLN 4-0 FS2 18XMF BLK (SUTURE) ×2 IMPLANT
SUTURE MAGNUM WIRE 2X48 BLK (SUTURE) ×2 IMPLANT
SUTURE MNCRL 4-0 27XMF (SUTURE) IMPLANT
TAPE MICROFOAM 4IN (TAPE) ×2 IMPLANT
TUBING ARTHRO INFLOW-ONLY STRL (TUBING) ×2 IMPLANT
TUBING CONNECTING 10 (TUBING) ×2 IMPLANT
WAND HAND CNTRL MULTIVAC 90 (MISCELLANEOUS) ×2 IMPLANT
WRAPON POLAR PAD SHDR XLG (MISCELLANEOUS) ×2

## 2018-07-20 NOTE — Discharge Instructions (Signed)
AMBULATORY SURGERY  °DISCHARGE INSTRUCTIONS ° ° °1) The drugs that you were given will stay in your system until tomorrow so for the next 24 hours you should not: ° °A) Drive an automobile °B) Make any legal decisions °C) Drink any alcoholic beverage ° ° °2) You may resume regular meals tomorrow.  Today it is better to start with liquids and gradually work up to solid foods. ° °You may eat anything you prefer, but it is better to start with liquids, then soup and crackers, and gradually work up to solid foods. ° ° °3) Please notify your doctor immediately if you have any unusual bleeding, trouble breathing, redness and pain at the surgery site, drainage, fever, or pain not relieved by medication. ° ° ° °4) Additional Instructions: ° ° ° ° ° ° ° °Please contact your physician with any problems or Same Day Surgery at [phone], Monday through Friday 6 am to 4 pm, or [place] [facility] number at [phone]. °

## 2018-07-20 NOTE — Op Note (Signed)
07/20/2018  1:04 PM  PATIENT:  Marvin Dominguez  27 y.o. 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    I discussed the risks and benefits of surgery. The risks include but are not limited to infection, bleeding, nerve or blood vessel injury, joint stiffness or loss of motion, persistent pain, weakness or instability, and hardware failure and the need for further surgery. Medical risks include but are not limited to DVT and pulmonary embolism, myocardial infarction, stroke, pneumonia, respiratory failure and death. Patient understood these risks and wished to proceed.   OPERATIVE IMPLANTS: Arthrex bio suturetak anchors  2   OPERATIVE FINDINGS:  Right shoulder anterior labral tear with chondral flap, fraying of superior labrum  OPERATIVE PROCEDURE:  I met with the patient in the preoperative area.  I signed the right shoulder according the hospital's correct site of surgery protocol.  I answered all questions by the patient.   Patient received an interscalene block with Exparel by the anesthesia service in the preoperative area.  Patient was brought to the operating room.  He underwent general endotracheal intubation.  The patient  was then positioned in a beachchair position. All bony prominences were adequately padded including the lower extremities.  Examination under anesthesia revealed no instability with load and shift testing, and a negative sulcus sign testing respectively.  The patient was then prepped and draped in a sterile fashion. The patient received 2 g of Ancef prior to the onset of the case.  A timeout was performed to verify the patient's name, date of birth, medical record number, correct site of surgery and correct procedure to be performed.The timeout was also used to verify the patient received antibiotics that all appropriate instruments, implants and radiographic studies were available in the room. Once all in attendance were in agreement case began.  Bony landmarks were drawn out with a surgical marker along with proposed incisions. These were pre-injected with 1% lidocaine plain. An 11 blade was used to establish a posterior portal through which the arthroscope was placed in the glenohumeral joint. An anterior portal was established under direct visualization using an 18-gauge spinal needle for localization. A 5.75 mm arthroscopic cannula was inserted through the anterior portal. A full diagnostic examination of the glenohumeral joint was performed.  An anterolateral portal was established again under direct visualization using an 18-gauge spinal needle. A 7 mm cannula was placed through this anterolateral portal.  The superior and posterior labrum were debrided with a 4.0 mm resector shaver blade.  There is no evidence for a SLAP tear.  The subscapularis and biceps tendons were intact.  There is no evidence of a rotator cuff tear.    An arthroscopic periosteal elevator was used to mobilize the anterior labrum.  The associated chondral flap lesion on the bony glenoid at the site of  the labral tear was debrided with a 4.0 mm resector shaver blade.  The nonarticular portion of the anterior glenoid at the site  of the labral tear was burred on the anterior surface until punctate bleeding was identified. An Arthrex NIKE anchor was then placed.  A 90 degree Arthrex suture lasso was then used to shuttle a single limb of this anchor under the anterior inferior labrum.  Arthroscopic knot tying technique was then used to complete the capsulorrhaphy anteriorly.  This process was then repeated for a second anchor placed occipitally 10 mm superior to the previous anchor.  The labrum was then probed following placement of both anchors.  The labrum was well approximated to the glenoid.  The anterior bumper was reestablished.  The labrum filled the void left by the chondral lesion previously debrided. Final images of the repair were taken.  The arthroscope was then placed in the subacromial space.  A lateral portal was established using an 18-gauge spinal needle for localization.  Abundant bursitis was encountered.  This was debrided with a 9 degree ArthroCare wand and 4.0 mm resector shaver blade.  The rotator cuff was examined from the bursal side and showed no evidence of rotator cuff tear.  A subacromial decompression was performed given a subacromial spur anteriorly.  Final arthroscopic images were taken.   All arthroscopic instruments were removed. The 4 arthroscopic portals were closed with 4-0 nylon. A dry, sterile dressing was applied to the right shoulder, along with a Polar Care sleeve. Patient's right arm was then placed in an abduction sling.  He was awoken and brought to PACU in stable condition. I was scrubbed and present for the entire case and all sharp, sponge and instrument counts were correct at the conclusion the case. I spoke with the patient's family including his wife mother and grandmother in the postop consultation room to let them know the operation was successful and performed without complication.      Timoteo Gaul, MD

## 2018-07-20 NOTE — Transfer of Care (Signed)
Immediate Anesthesia Transfer of Care Note  Patient: Marvin Dominguez  Procedure(s) Performed: SHOULDER ARTHROSCOPY, SURGICAL CAPSULORRHAPHY, ANTERIOR LABRAL REPAIR (Right Shoulder)  Patient Location: PACU  Anesthesia Type:General  Level of Consciousness: awake, alert  and oriented  Airway & Oxygen Therapy: Patient Spontanous Breathing and Patient connected to nasal cannula oxygen  Post-op Assessment: Report given to RN and Post -op Vital signs reviewed and stable  Post vital signs: Reviewed and stable  Last Vitals:  Vitals Value Taken Time  BP    Temp    Pulse    Resp    SpO2      Last Pain:  Vitals:   07/20/18 0945  TempSrc:   PainSc: 0-No pain         Complications: No apparent anesthesia complications

## 2018-07-20 NOTE — Anesthesia Preprocedure Evaluation (Signed)
Anesthesia Evaluation  Patient identified by MRN, date of birth, ID band Patient awake    Reviewed: Allergy & Precautions, NPO status , Patient's Chart, lab work & pertinent test results  History of Anesthesia Complications Negative for: history of anesthetic complications  Airway Mallampati: III  TM Distance: >3 FB Neck ROM: Full    Dental  (+) Caps   Pulmonary neg pulmonary ROS, neg sleep apnea, neg COPD,    breath sounds clear to auscultation- rhonchi (-) wheezing      Cardiovascular Exercise Tolerance: Good (-) hypertension(-) CAD and (-) Past MI  Rhythm:Regular Rate:Normal - Systolic murmurs and - Diastolic murmurs    Neuro/Psych negative neurological ROS  negative psych ROS   GI/Hepatic negative GI ROS, Neg liver ROS,   Endo/Other  negative endocrine ROSneg diabetes  Renal/GU negative Renal ROS     Musculoskeletal negative musculoskeletal ROS (+)   Abdominal (+) - obese,   Peds  Hematology negative hematology ROS (+)   Anesthesia Other Findings    Reproductive/Obstetrics                             Anesthesia Physical Anesthesia Plan  ASA: I  Anesthesia Plan: General   Post-op Pain Management:  Regional for Post-op pain   Induction: Intravenous  PONV Risk Score and Plan: 1 and Ondansetron and Midazolam  Airway Management Planned: Oral ETT  Additional Equipment:   Intra-op Plan:   Post-operative Plan: Extubation in OR  Informed Consent: I have reviewed the patients History and Physical, chart, labs and discussed the procedure including the risks, benefits and alternatives for the proposed anesthesia with the patient or authorized representative who has indicated his/her understanding and acceptance.   Dental advisory given  Plan Discussed with: CRNA and Anesthesiologist  Anesthesia Plan Comments:         Anesthesia Quick Evaluation

## 2018-07-20 NOTE — Anesthesia Procedure Notes (Signed)
Anesthesia Regional Block: Interscalene brachial plexus block   Pre-Anesthetic Checklist: ,, timeout performed, Correct Patient, Correct Site, Correct Laterality, Correct Procedure, Correct Position, site marked, Risks and benefits discussed,  Surgical consent,  Pre-op evaluation,  At surgeon's request and post-op pain management  Laterality: Right  Prep: chloraprep       Needles:  Injection technique: Single-shot  Needle Type: Stimiplex     Needle Length: 10cm  Needle Gauge: 21     Additional Needles:   Procedures:,,,, ultrasound used (permanent image in chart),,,,  Narrative:  Start time: 07/20/2018 9:40 AM End time: 07/20/2018 9:45 AM Injection made incrementally with aspirations every 5 mL.  Performed by: Personally  Anesthesiologist: Alver FisherPenwarden, Effie Wahlert, MD  Additional Notes: Functioning IV was confirmed and monitors were applied.  A Stimuplex needle was used. Sterile prep and drape,hand hygiene and sterile gloves were used.  Negative aspiration and negative test dose prior to incremental administration of local anesthetic. The patient tolerated the procedure well.

## 2018-07-20 NOTE — Anesthesia Procedure Notes (Signed)
Procedure Name: Intubation Date/Time: 07/20/2018 10:25 AM Performed by: Chanetta Marshall, CRNA Pre-anesthesia Checklist: Patient identified, Emergency Drugs available, Suction available and Patient being monitored Patient Re-evaluated:Patient Re-evaluated prior to induction Oxygen Delivery Method: Circle system utilized Preoxygenation: Pre-oxygenation with 100% oxygen Induction Type: IV induction Ventilation: Mask ventilation without difficulty Laryngoscope Size: Mac and 3 Grade View: Grade I Number of attempts: 1 Placement Confirmation: ETT inserted through vocal cords under direct vision,  CO2 detector,  breath sounds checked- equal and bilateral and positive ETCO2 Secured at: 21 cm Tube secured with: Tape Dental Injury: Teeth and Oropharynx as per pre-operative assessment

## 2018-07-20 NOTE — Anesthesia Post-op Follow-up Note (Signed)
Anesthesia QCDR form completed.        

## 2018-07-20 NOTE — H&P (Signed)
The patient has been re-examined, and the chart reviewed, and there have been no interval changes to the documented history and physical.    The risks, benefits, and alternatives have been discussed at length, and the patient is willing to proceed.   

## 2018-07-20 NOTE — Anesthesia Postprocedure Evaluation (Signed)
Anesthesia Post Note  Patient: Susann Givensrevor A [place]  Procedure(s) Performed: SHOULDER ARTHROSCOPY, SURGICAL CAPSULORRHAPHY, ANTERIOR LABRAL REPAIR (Right Shoulder)  Patient location during evaluation: PACU Anesthesia Type: General Level of consciousness: awake and alert and oriented Pain management: pain level controlled Vital Signs Assessment: post-procedure vital signs reviewed and stable Respiratory status: spontaneous breathing, nonlabored ventilation and respiratory function stable Cardiovascular status: blood pressure returned to baseline and stable Postop Assessment: no signs of nausea or vomiting Anesthetic complications: no     Last Vitals:  Vitals:   07/20/18 1244 07/20/18 1259  BP: 133/65 117/66  Pulse: 98 83  Resp: 17 20  Temp: (!) 36.1 C   SpO2: 97% 96%    Last Pain:  Vitals:   07/20/18 1259  TempSrc:   PainSc: 0-No pain                 Lyla Jasek

## 2019-09-01 IMAGING — MR MR SHOULDER*R* W/CM
6 series · 40 of 40 positions shown · IV contrast (agent unspecified)
Comparison: Image from contrast injection reviewed.

CLINICAL DATA: Right shoulder pain since an injury suffered in a
fall in September 2016. Limited range of motion.

EXAM:
MR ARTHROGRAM OF THE RIGHT SHOULDER
TECHNIQUE: Multiplanar, multisequence MR imaging of the right shoulder was
performed following the administration of intra-articular contrast.
CONTRAST:  See Injection Documentation.

[Series 3: T1 fat-sat · axial · 4.0mm · 0.55mm/px · z∈[-23,+81]mm · 6 of 25 slices shown (1 of 4)]
[im 1/25]
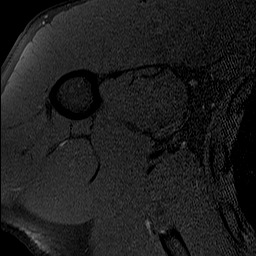
[im 5/25]
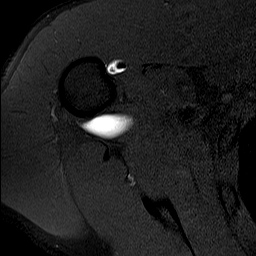
[im 10/25]
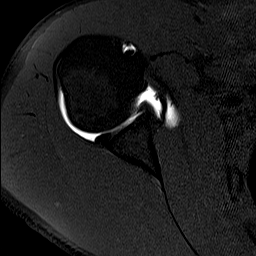
[im 15/25]
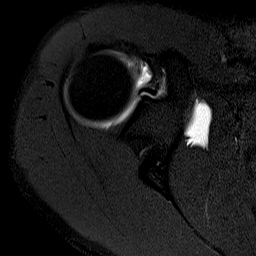
[im 20/25]
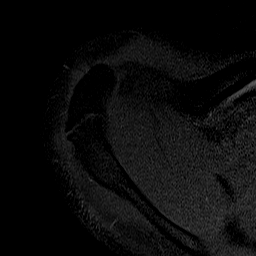
[im 25/25]
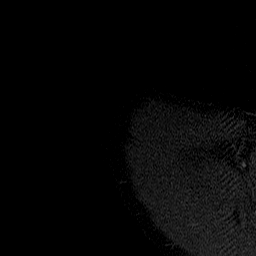

[Series 4: T1 fat-sat · sagittal · 4.0mm · 0.62mm/px · 7 of 23 slices shown (2 of 4)]
[im 1/23]
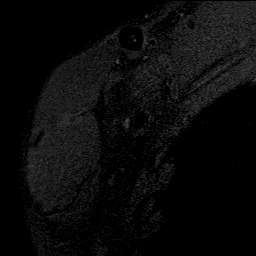
[im 4/23]
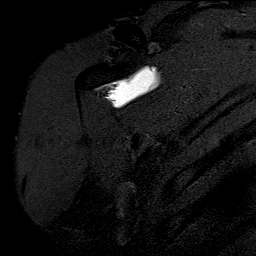
[im 8/23]
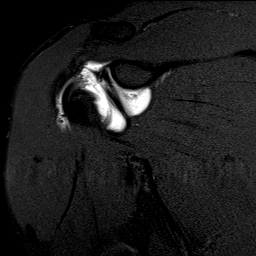
[im 12/23]
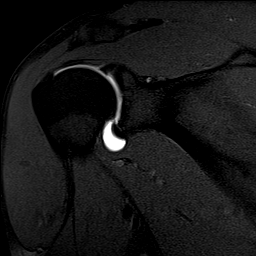
[im 15/23]
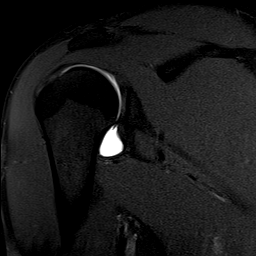
[im 19/23]
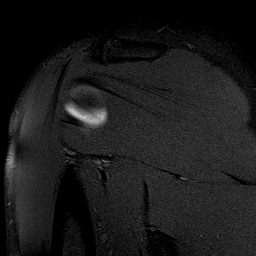
[im 23/23]
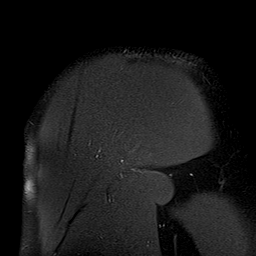

[Series 5: T2 fat-sat · sagittal · 4.0mm · 0.62mm/px · 7 of 23 slices shown (1 of 2)]
[im 1/23]
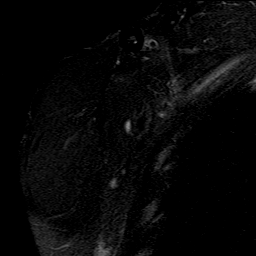
[im 4/23]
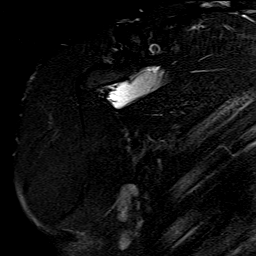
[im 8/23]
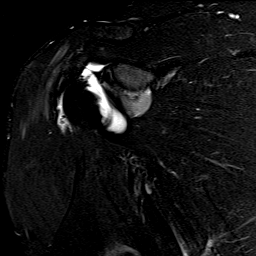
[im 12/23]
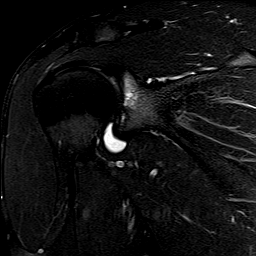
[im 15/23]
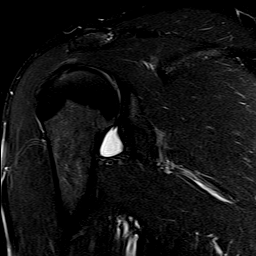
[im 19/23]
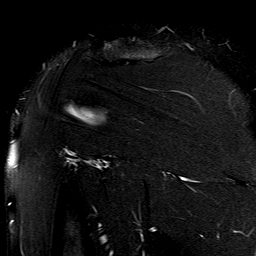
[im 23/23]
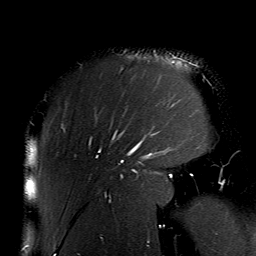

[Series 6: T1 fat-sat · sagittal · non-contrast · 4.0mm · 0.42mm/px · 7 of 23 slices shown (3 of 4)]
[im 1/23]
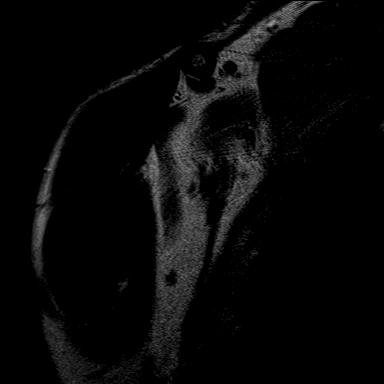
[im 4/23]
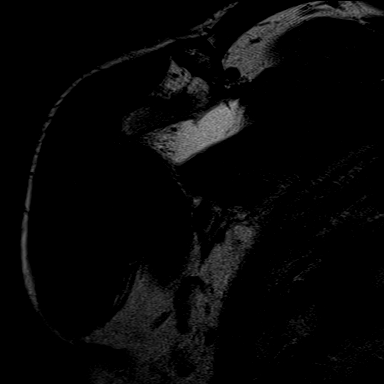
[im 8/23]
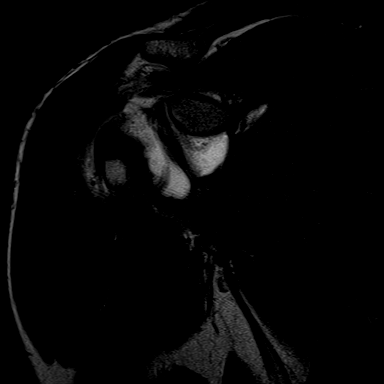
[im 12/23]
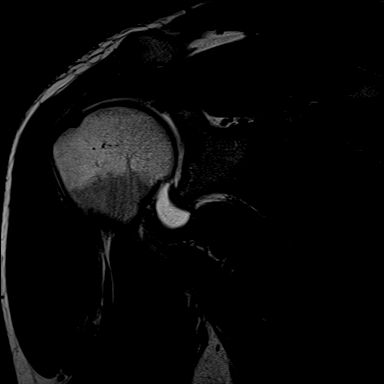
[im 15/23]
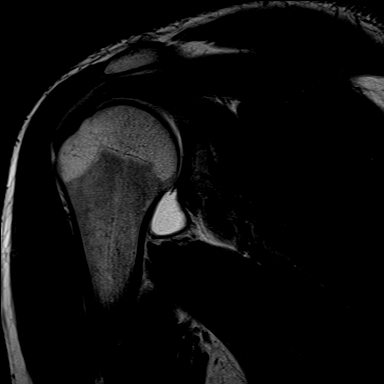
[im 19/23]
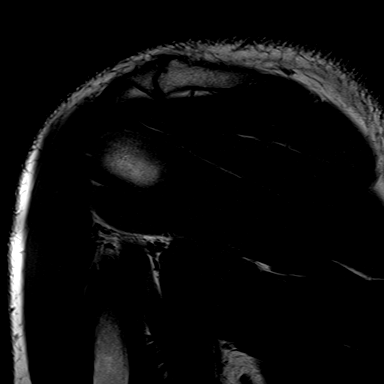
[im 23/23]
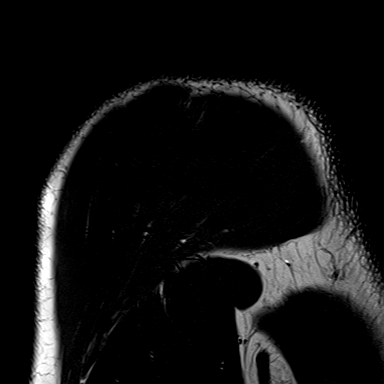

[Series 7: T2 fat-sat · coronal · 4.0mm · 0.62mm/px · 7 of 25 slices shown (2 of 2)]
[im 1/25]
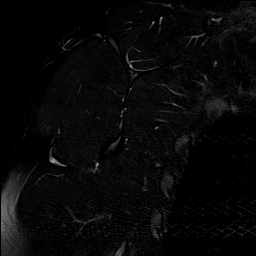
[im 5/25]
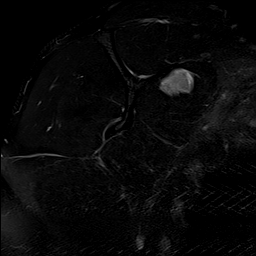
[im 9/25]
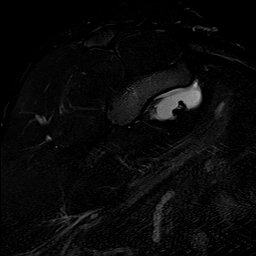
[im 13/25]
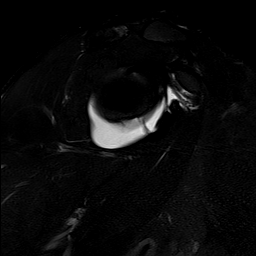
[im 17/25]
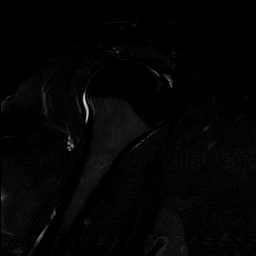
[im 21/25]
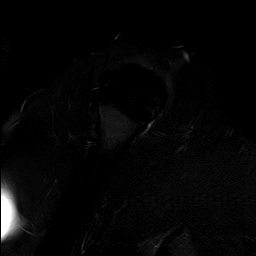
[im 25/25]
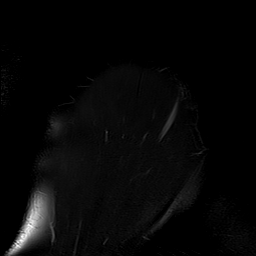

[Series 10: T1 fat-sat · sagittal · 4.0mm · 0.62mm/px · 6 of 22 slices shown (4 of 4)]
[im 1/22]
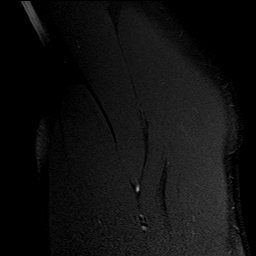
[im 5/22]
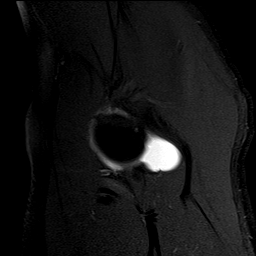
[im 9/22]
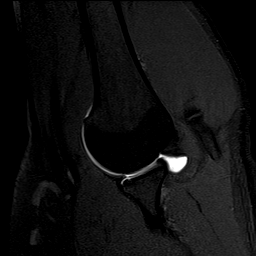
[im 13/22]
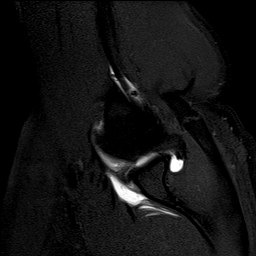
[im 17/22]
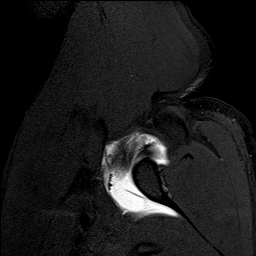
[im 22/22]
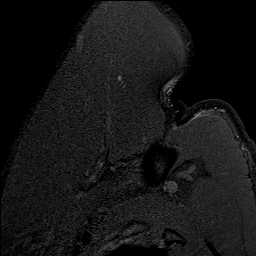

[40 of 40 positions shown; findings below may reference images not displayed]

FINDINGS: Rotator cuff: Intact and normal in appearance.

Muscles: Normal without atrophy or focal lesion.

Biceps long head: Intact and normal in appearance. The biceps anchor
is normal.

Acromioclavicular Joint: Normal.

Glenohumeral Joint: Focal cartilage loss is seen along the anterior,
inferior glenoid involving an area measuring approximately 0.7 cm
transverse by 0.7 cm craniocaudal. There is subchondral cyst
formation and edema in the anterior glenoid throughout.

Labrum: Sublabral foramen is noted. The patient also has a tear of
the anterior, inferior labrum extending from the 3 o'clock position
inferiorly to near the 6 o'clock position.

Bones: No fracture or focal lesion.
IMPRESSION: Findings consistent with a GLAD lesion with a nondisplaced anterior,
inferior labral tear and large cartilage defect along the anterior,
inferior glenoid. Secondary subchondral edema and cyst formation in
the anterior glenoid are identified.

Intact and normal appearing rotator cuff and long head of biceps.
